# Patient Record
Sex: Male | Born: 1975 | Race: White | Hispanic: No | Marital: Married | Smoking: Former smoker
Health system: Southern US, Community
[De-identification: ages and names within clinical notes are randomized; demographics above are authoritative.]

## PROBLEM LIST (undated history)

## (undated) DIAGNOSIS — R51 Headache: Secondary | ICD-10-CM

## (undated) DIAGNOSIS — I10 Essential (primary) hypertension: Secondary | ICD-10-CM

## (undated) DIAGNOSIS — M1990 Unspecified osteoarthritis, unspecified site: Secondary | ICD-10-CM

## (undated) DIAGNOSIS — E119 Type 2 diabetes mellitus without complications: Secondary | ICD-10-CM

## (undated) HISTORY — PX: PILONIDAL CYST / SINUS EXCISION: SUR543

---

## 2011-11-11 ENCOUNTER — Other Ambulatory Visit: Payer: Self-pay | Admitting: Orthopedic Surgery

## 2011-11-12 ENCOUNTER — Encounter (HOSPITAL_COMMUNITY): Payer: Self-pay | Admitting: Respiratory Therapy

## 2011-11-13 ENCOUNTER — Encounter (HOSPITAL_COMMUNITY)
Admission: RE | Admit: 2011-11-13 | Discharge: 2011-11-13 | Disposition: A | Discharge status: Home / Self Care | Payer: Managed Care, Other (non HMO) | Source: Ambulatory Visit | Attending: Orthopedic Surgery | Admitting: Orthopedic Surgery

## 2011-11-13 ENCOUNTER — Encounter (HOSPITAL_COMMUNITY): Payer: Self-pay

## 2011-11-13 HISTORY — DX: Headache: R51

## 2011-11-13 HISTORY — DX: Unspecified osteoarthritis, unspecified site: M19.90

## 2011-11-13 LAB — URINALYSIS, ROUTINE W REFLEX MICROSCOPIC
Bilirubin Urine: NEGATIVE
Hgb urine dipstick: NEGATIVE
Ketones, ur: NEGATIVE mg/dL
Specific Gravity, Urine: 1.024 (ref 1.005–1.030)
Urobilinogen, UA: 0.2 mg/dL (ref 0.0–1.0)

## 2011-11-13 LAB — COMPREHENSIVE METABOLIC PANEL
Alkaline Phosphatase: 82 U/L (ref 39–117)
BUN: 9 mg/dL (ref 6–23)
Calcium: 10.3 mg/dL (ref 8.4–10.5)
Creatinine, Ser: 1.01 mg/dL (ref 0.50–1.35)
GFR calc Af Amer: >90 mL/min (ref >90)
Glucose, Bld: 105 mg/dL — ABNORMAL HIGH (ref 70–99)
Total Protein: 7.5 g/dL (ref 6.0–8.3)

## 2011-11-13 LAB — CBC WITH DIFFERENTIAL/PLATELET
Basophils Relative: 0 % (ref 0–1)
Eosinophils Absolute: 0.2 10*3/uL (ref 0.0–0.7)
Eosinophils Relative: 2 % (ref 0–5)
Hemoglobin: 17.7 g/dL — ABNORMAL HIGH (ref 13.0–17.0)
Lymphs Abs: 2.3 10*3/uL (ref 0.7–4.0)
MCH: 30.3 pg (ref 26.0–34.0)
MCHC: 36.4 g/dL — ABNORMAL HIGH (ref 30.0–36.0)
MCV: 83.1 fL (ref 78.0–100.0)
Monocytes Absolute: 0.5 10*3/uL (ref 0.1–1.0)
Monocytes Relative: 5 % (ref 3–12)
RBC: 5.85 MIL/uL — ABNORMAL HIGH (ref 4.22–5.81)

## 2011-11-13 LAB — TYPE AND SCREEN
ABO/RH(D): O POS
Antibody Screen: NEGATIVE

## 2011-11-13 LAB — PROTIME-INR
INR: 0.97 (ref 0.00–1.49)
Prothrombin Time: 13.1 seconds (ref 11.6–15.2)

## 2011-11-13 LAB — SURGICAL PCR SCREEN
MRSA, PCR: NEGATIVE
Staphylococcus aureus: POSITIVE — AB

## 2011-11-13 NOTE — Progress Notes (Signed)
REQ'D PRIOR EKG IF ANY AND OFFICE NOTES FROM [facility]

## 2011-11-13 NOTE — Pre-Procedure Instructions (Addendum)
20 Marvin Franco  11/13/2011   Your procedure is scheduled on:  11/20/11  Report to [facility] at 630 AM.  Call this number if you have problems the morning of surgery: [phone]   Remember:   Do not eat food or drink:After Midnight.    Take these medicines the morning of surgery with A SIP OF WATER:pain med   Do not wear jewelry, make-up or nail polish.  Do not wear lotions, powders, or perfumes. You may wear deodorant.  Do not shave 48 hours prior to surgery. Men may shave face and neck.  Do not bring valuables to the hospital.  Contacts, dentures or bridgework may not be worn into surgery.  Leave suitcase in the car. After surgery it may be brought to your room.  For patients admitted to the hospital, checkout time is 11:00 AM the day of discharge.   Patients discharged the day of surgery will not be allowed to drive home.  Name and phone number of your driver:brandi  Wife [phone]   Special Instructions: Incentive Spirometry - Practice and bring it with you on the day of surgery. and CHG Shower Use Special Wash: 1/2 bottle night before surgery and 1/2 bottle morning of surgery.   Please read over the following fact sheets that you were given: Pain Booklet, Coughing and Deep Breathing, Blood Transfusion Information, MRSA Information and Surgical Site Infection Prevention

## 2011-11-19 MED ORDER — DEXTROSE 5 % IV SOLN
3.0000 g | INTRAVENOUS | Status: AC
  Administered 2011-11-20: 3 g via INTRAVENOUS
  Filled 2011-11-19: qty 3000

## 2011-11-20 ENCOUNTER — Ambulatory Visit (HOSPITAL_COMMUNITY): Payer: Managed Care, Other (non HMO) | Admitting: Anesthesiology

## 2011-11-20 ENCOUNTER — Encounter (HOSPITAL_COMMUNITY)
Admission: RE | Disposition: A | Discharge status: Home / Self Care | Payer: Self-pay | Source: Ambulatory Visit | Attending: Orthopedic Surgery

## 2011-11-20 ENCOUNTER — Encounter (HOSPITAL_COMMUNITY): Payer: Self-pay | Admitting: Anesthesiology

## 2011-11-20 ENCOUNTER — Encounter (HOSPITAL_COMMUNITY): Payer: Self-pay | Admitting: *Deleted

## 2011-11-20 ENCOUNTER — Ambulatory Visit (HOSPITAL_COMMUNITY)
Admission: RE | Admit: 2011-11-20 | Discharge: 2011-11-20 | Disposition: A | Discharge status: Home / Self Care | Payer: Managed Care, Other (non HMO) | Source: Ambulatory Visit | Attending: Orthopedic Surgery | Admitting: Orthopedic Surgery

## 2011-11-20 ENCOUNTER — Ambulatory Visit (HOSPITAL_COMMUNITY): Payer: Managed Care, Other (non HMO)

## 2011-11-20 DIAGNOSIS — M5410 Radiculopathy, site unspecified: Secondary | ICD-10-CM

## 2011-11-20 DIAGNOSIS — F17200 Nicotine dependence, unspecified, uncomplicated: Secondary | ICD-10-CM | POA: Insufficient documentation

## 2011-11-20 DIAGNOSIS — M5126 Other intervertebral disc displacement, lumbar region: Secondary | ICD-10-CM | POA: Insufficient documentation

## 2011-11-20 DIAGNOSIS — Z01812 Encounter for preprocedural laboratory examination: Secondary | ICD-10-CM | POA: Insufficient documentation

## 2011-11-20 DIAGNOSIS — Z01818 Encounter for other preprocedural examination: Secondary | ICD-10-CM | POA: Insufficient documentation

## 2011-11-20 DIAGNOSIS — Z01810 Encounter for preprocedural cardiovascular examination: Secondary | ICD-10-CM | POA: Insufficient documentation

## 2011-11-20 HISTORY — PX: LUMBAR LAMINECTOMY/DECOMPRESSION MICRODISCECTOMY: SHX5026

## 2011-11-20 SURGERY — LUMBAR LAMINECTOMY/DECOMPRESSION MICRODISCECTOMY
Anesthesia: General | Site: Spine Lumbar | Laterality: Right | Wound class: Clean

## 2011-11-20 MED ORDER — NEOSTIGMINE METHYLSULFATE 1 MG/ML IJ SOLN
INTRAMUSCULAR | Status: DC | PRN
  Administered 2011-11-20: 3 mg via INTRAVENOUS

## 2011-11-20 MED ORDER — MIDAZOLAM HCL 5 MG/5ML IJ SOLN
INTRAMUSCULAR | Status: DC | PRN
  Administered 2011-11-20: 2 mg via INTRAVENOUS

## 2011-11-20 MED ORDER — LIDOCAINE HCL (CARDIAC) 20 MG/ML IV SOLN
INTRAVENOUS | Status: DC | PRN
  Administered 2011-11-20: 80 mg via INTRAVENOUS

## 2011-11-20 MED ORDER — ONDANSETRON HCL 4 MG/2ML IJ SOLN
INTRAMUSCULAR | Status: DC | PRN
  Administered 2011-11-20: 4 mg via INTRAVENOUS

## 2011-11-20 MED ORDER — HYDROMORPHONE HCL PF 1 MG/ML IJ SOLN
INTRAMUSCULAR | Status: AC
  Administered 2011-11-20: 0.5 mg via INTRAVENOUS
  Filled 2011-11-20: qty 1

## 2011-11-20 MED ORDER — BUPIVACAINE-EPINEPHRINE PF 0.25-1:200000 % IJ SOLN
INTRAMUSCULAR | Status: AC
  Filled 2011-11-20: qty 30

## 2011-11-20 MED ORDER — OXYCODONE-ACETAMINOPHEN 5-325 MG PO TABS: 1.0000 | ORAL_TABLET | ORAL | Status: AC | PRN

## 2011-11-20 MED ORDER — INDIGOTINDISULFONATE SODIUM 8 MG/ML IJ SOLN
INTRAMUSCULAR | Status: AC
  Filled 2011-11-20: qty 5

## 2011-11-20 MED ORDER — GLYCOPYRROLATE 0.2 MG/ML IJ SOLN
INTRAMUSCULAR | Status: DC | PRN
  Administered 2011-11-20: .4 mg via INTRAVENOUS

## 2011-11-20 MED ORDER — PROPOFOL 10 MG/ML IV EMUL
INTRAVENOUS | Status: DC | PRN
  Administered 2011-11-20: 300 mg via INTRAVENOUS

## 2011-11-20 MED ORDER — 0.9 % SODIUM CHLORIDE (POUR BTL) OPTIME
TOPICAL | Status: DC | PRN
  Administered 2011-11-20: 1000 mL

## 2011-11-20 MED ORDER — THROMBIN 20000 UNITS EX SOLR
CUTANEOUS | Status: DC | PRN
  Administered 2011-11-20: 09:00:00 via TOPICAL

## 2011-11-20 MED ORDER — BUPIVACAINE-EPINEPHRINE PF 0.25-1:200000 % IJ SOLN
INTRAMUSCULAR | Status: DC | PRN
  Administered 2011-11-20: 2 mL

## 2011-11-20 MED ORDER — METHYLPREDNISOLONE ACETATE 40 MG/ML IJ SUSP
40.0000 mg | INTRAMUSCULAR | Status: DC
  Filled 2011-11-20: qty 1

## 2011-11-20 MED ORDER — THROMBIN 20000 UNITS EX SOLR
CUTANEOUS | Status: AC
  Filled 2011-11-20: qty 20000

## 2011-11-20 MED ORDER — HYDROMORPHONE HCL PF 1 MG/ML IJ SOLN
0.2500 mg | INTRAMUSCULAR | Status: DC | PRN
  Administered 2011-11-20 (×4): 0.5 mg via INTRAVENOUS

## 2011-11-20 MED ORDER — POVIDONE-IODINE 7.5 % EX SOLN
Freq: Once | CUTANEOUS | Status: DC
  Filled 2011-11-20: qty 118

## 2011-11-20 MED ORDER — ROCURONIUM BROMIDE 100 MG/10ML IV SOLN
INTRAVENOUS | Status: DC | PRN
  Administered 2011-11-20 (×2): 10 mg via INTRAVENOUS
  Administered 2011-11-20: 50 mg via INTRAVENOUS

## 2011-11-20 MED ORDER — FENTANYL CITRATE 0.05 MG/ML IJ SOLN
INTRAMUSCULAR | Status: DC | PRN
  Administered 2011-11-20: 100 ug via INTRAVENOUS
  Administered 2011-11-20: 50 ug via INTRAVENOUS
  Administered 2011-11-20: 100 ug via INTRAVENOUS
  Administered 2011-11-20 (×2): 50 ug via INTRAVENOUS

## 2011-11-20 MED ORDER — INDIGOTINDISULFONATE SODIUM 8 MG/ML IJ SOLN
INTRAMUSCULAR | Status: DC | PRN
  Administered 2011-11-20: .5 mL

## 2011-11-20 MED ORDER — LACTATED RINGERS IV SOLN
INTRAVENOUS | Status: DC | PRN
  Administered 2011-11-20 (×2): via INTRAVENOUS

## 2011-11-20 MED ORDER — LIDOCAINE HCL 4 % MT SOLN
OROMUCOSAL | Status: DC | PRN
  Administered 2011-11-20: 4 mL via TOPICAL

## 2011-11-20 MED ORDER — DIAZEPAM 5 MG PO TABS: 5.0000 mg | ORAL_TABLET | Freq: Four times a day (QID) | ORAL | Status: AC | PRN

## 2011-11-20 SURGICAL SUPPLY — 58 items
BENZOIN TINCTURE PRP APPL 2/3 (GAUZE/BANDAGES/DRESSINGS) ×2 IMPLANT
BUR ROUND PRECISION 4.0 (BURR) ×2 IMPLANT
CANISTER SUCTION 2500CC (MISCELLANEOUS) ×2 IMPLANT
CLOSURE STERI-STRIP 1/4X4 (GAUZE/BANDAGES/DRESSINGS) ×2 IMPLANT
CLOTH BEACON ORANGE TIMEOUT ST (SAFETY) ×2 IMPLANT
CONT SPEC STER OR (MISCELLANEOUS) ×2 IMPLANT
CORDS BIPOLAR (ELECTRODE) ×2 IMPLANT
COVER SURGICAL LIGHT HANDLE (MISCELLANEOUS) ×2 IMPLANT
DRAIN CHANNEL 10F 3/8 F FF (DRAIN) IMPLANT
DRAPE POUCH INSTRU U-SHP 10X18 (DRAPES) ×4 IMPLANT
DRAPE SURG 17X23 STRL (DRAPES) ×8 IMPLANT
DURAPREP 26ML APPLICATOR (WOUND CARE) ×2 IMPLANT
ELECT BLADE 4.0 EZ CLEAN MEGAD (MISCELLANEOUS) ×2
ELECT BLADE 6.5 EXT (BLADE) IMPLANT
ELECT CAUTERY BLADE 6.4 (BLADE) ×2 IMPLANT
ELECT REM PT RETURN 9FT ADLT (ELECTROSURGICAL) ×2
ELECTRODE BLDE 4.0 EZ CLN MEGD (MISCELLANEOUS) ×1 IMPLANT
ELECTRODE REM PT RTRN 9FT ADLT (ELECTROSURGICAL) ×1 IMPLANT
EVACUATOR SILICONE 100CC (DRAIN) IMPLANT
FILTER STRAW FLUID ASPIR (MISCELLANEOUS) ×2 IMPLANT
GAUZE SPONGE 4X4 16PLY XRAY LF (GAUZE/BANDAGES/DRESSINGS) ×4 IMPLANT
GLOVE BIO SURGEON STRL SZ8 (GLOVE) ×2 IMPLANT
GLOVE BIOGEL PI IND STRL 8 (GLOVE) ×1 IMPLANT
GLOVE BIOGEL PI INDICATOR 8 (GLOVE) ×1
GOWN STRL NON-REIN LRG LVL3 (GOWN DISPOSABLE) ×4 IMPLANT
IV CATH 14GX2 1/4 (CATHETERS) ×2 IMPLANT
KIT BASIN OR (CUSTOM PROCEDURE TRAY) ×2 IMPLANT
KIT ROOM TURNOVER OR (KITS) ×2 IMPLANT
NEEDLE 18GX1X1/2 (RX/OR ONLY) (NEEDLE) ×2 IMPLANT
NEEDLE HYPO 25GX1X1/2 BEV (NEEDLE) ×2 IMPLANT
NEEDLE SPNL 18GX3.5 QUINCKE PK (NEEDLE) ×4 IMPLANT
NS IRRIG 1000ML POUR BTL (IV SOLUTION) ×2 IMPLANT
PACK LAMINECTOMY ORTHO (CUSTOM PROCEDURE TRAY) ×2 IMPLANT
PACK UNIVERSAL I (CUSTOM PROCEDURE TRAY) ×2 IMPLANT
PAD ARMBOARD 7.5X6 YLW CONV (MISCELLANEOUS) ×4 IMPLANT
PATTIES SURGICAL .5 X.5 (GAUZE/BANDAGES/DRESSINGS) IMPLANT
PATTIES SURGICAL .5 X1 (DISPOSABLE) IMPLANT
PATTIES SURGICAL 1X1 (DISPOSABLE) IMPLANT
SPONGE GAUZE 4X4 12PLY (GAUZE/BANDAGES/DRESSINGS) ×2 IMPLANT
STRIP CLOSURE SKIN 1/2X4 (GAUZE/BANDAGES/DRESSINGS) IMPLANT
SURGIFLO TRUKIT (HEMOSTASIS) IMPLANT
SUT ETHILON 3 0 FSL (SUTURE) IMPLANT
SUT VIC AB 0 CT1 27 (SUTURE)
SUT VIC AB 0 CT1 27XBRD ANBCTR (SUTURE) IMPLANT
SUT VIC AB 0 CT2 27 (SUTURE) ×2 IMPLANT
SUT VIC AB 1 CT1 18XCR BRD 8 (SUTURE) ×1 IMPLANT
SUT VIC AB 1 CT1 8-18 (SUTURE) ×1
SUT VIC AB 2-0 CT2 18 VCP726D (SUTURE) ×2 IMPLANT
SYR 20CC LL (SYRINGE) IMPLANT
SYR BULB IRRIGATION 50ML (SYRINGE) ×2 IMPLANT
SYR CONTROL 10ML LL (SYRINGE) ×4 IMPLANT
SYR TB 1ML 26GX3/8 SAFETY (SYRINGE) ×4 IMPLANT
SYR TB 1ML LUER SLIP (SYRINGE) ×4 IMPLANT
TAPE CLOTH SURG 4X10 WHT LF (GAUZE/BANDAGES/DRESSINGS) ×2 IMPLANT
TOWEL OR 17X24 6PK STRL BLUE (TOWEL DISPOSABLE) ×2 IMPLANT
TOWEL OR 17X26 10 PK STRL BLUE (TOWEL DISPOSABLE) ×2 IMPLANT
WATER STERILE IRR 1000ML POUR (IV SOLUTION) ×2 IMPLANT
YANKAUER SUCT BULB TIP NO VENT (SUCTIONS) ×2 IMPLANT

## 2011-11-20 NOTE — Anesthesia Preprocedure Evaluation (Addendum)
Anesthesia Evaluation  Patient identified by MRN, date of birth, ID band Patient awake    Reviewed: Allergy & Precautions, H&P , NPO status , Patient's Chart, lab work & pertinent test results, reviewed documented beta blocker date and time   Airway Mallampati: II TM Distance: >3 FB Neck ROM: Full    Dental  (+) Teeth Intact   Pulmonary Current Smoker,  breath sounds clear to auscultation  Pulmonary exam normal       Cardiovascular negative cardio ROS  Rhythm:Regular Rate:Normal     Neuro/Psych  Headaches, Pain symptoms noted.    GI/Hepatic negative GI ROS, Neg liver ROS,   Endo/Other  negative endocrine ROS  Renal/GU negative Renal ROS     Musculoskeletal  (+) Arthritis -,   Abdominal Normal abdominal exam  (+)   Peds  Hematology   Anesthesia Other Findings   Reproductive/Obstetrics                         Anesthesia Physical Anesthesia Plan  ASA: II  Anesthesia Plan: General   Post-op Pain Management:    Induction: Intravenous  Airway Management Planned: Oral ETT  Additional Equipment:   Intra-op Plan:   Post-operative Plan: Extubation in OR  Informed Consent: I have reviewed the patients History and Physical, chart, labs and discussed the procedure including the risks, benefits and alternatives for the proposed anesthesia with the patient or authorized representative who has indicated his/her understanding and acceptance.   Dental advisory given  Plan Discussed with: CRNA, Anesthesiologist and Surgeon  Anesthesia Plan Comments:         Anesthesia Quick Evaluation

## 2011-11-20 NOTE — Anesthesia Postprocedure Evaluation (Signed)
  Anesthesia Post-op Note  Patient: Marvin Franco  Procedure(s) Performed: Procedure(s) (LRB) with comments: LUMBAR LAMINECTOMY/DECOMPRESSION MICRODISCECTOMY (Right) - Right-sided lumbar 5-sacral 1 microdisectomy  Patient Location: PACU  Anesthesia Type: General  Level of Consciousness: awake  Airway and Oxygen Therapy: Patient Spontanous Breathing  Post-op Pain: mild  Post-op Assessment: Post-op Vital signs reviewed  Post-op Vital Signs: Reviewed  Complications: No apparent anesthesia complications

## 2011-11-20 NOTE — Transfer of Care (Signed)
Immediate Anesthesia Transfer of Care Note  Patient: Marvin Franco  Procedure(s) Performed: Procedure(s) (LRB) with comments: LUMBAR LAMINECTOMY/DECOMPRESSION MICRODISCECTOMY (Right) - Right-sided lumbar 5-sacral 1 microdisectomy  Patient Location: PACU  Anesthesia Type: General  Level of Consciousness: awake, alert  and oriented  Airway & Oxygen Therapy: Patient Spontanous Breathing and Patient connected to face mask oxygen  Post-op Assessment: Report given to PACU RN  Post vital signs: Reviewed and stable  Complications: No apparent anesthesia complications

## 2011-11-20 NOTE — H&P (Signed)
PREOPERATIVE H&P  Chief Complaint: right leg pain  HPI: Marvin Franco is a 36 y.o. male who presents with right leg pain  Past Medical History  Diagnosis Date  . Headache     migraines  . Arthritis    Past Surgical History  Procedure Date  . Pilonidal cyst / sinus excision    History   Social History  . Marital Status: Married    Spouse Name: N/A    Number of Children: N/A  . Years of Education: N/A   Social History Main Topics  . Smoking status: Former Smoker -- 1.0 packs/day for 20 years    Types: Cigarettes  . Smokeless tobacco: None  . Alcohol Use: No  . Drug Use: No  . Sexually Active:    Other Topics Concern  . None   Social History Narrative  . None   History reviewed. No pertinent family history. No Known Allergies Prior to Admission medications   Medication Sig Start Date End Date Taking? Authorizing Provider  HYDROcodone-acetaminophen (NORCO) 10-325 MG per tablet Take 1 tablet by mouth every 6 (six) hours as needed. For pain   Yes Historical Provider, MD     All other systems have been reviewed and were otherwise negative with the exception of those mentioned in the HPI and as above.  Physical Exam: There were no vitals filed for this visit.  General: Alert, no acute distress Cardiovascular: No pedal edema Respiratory: No cyanosis, no use of accessory musculature Skin: No lesions in the area of chief complaint Neurologic: Sensation intact distally Psychiatric: Patient is competent for consent with normal mood and affect Lymphatic: No axillary or cervical lymphadenopathy  MUSCULOSKELETAL: + SLR on riht  Assessment/Plan: Right leg pain Plan for Procedure(s): LUMBAR LAMINECTOMY/DECOMPRESSION MICRODISCECTOMY   Emilee Hero, MD 11/20/2011 6:36 AM

## 2011-11-20 NOTE — Preoperative (Signed)
Beta Blockers   Reason not to administer Beta Blockers:Not Applicable 

## 2011-11-21 ENCOUNTER — Encounter (HOSPITAL_COMMUNITY): Payer: Self-pay | Admitting: Orthopedic Surgery

## 2011-11-21 NOTE — Op Note (Signed)
NAMEELIU, BATCH NO.:  [id number]  MEDICAL RECORD NO.:  [id number]  LOCATION:  [facility]                         FACILITY:  [facility]  PHYSICIAN:  Estill Bamberg, MD      DATE OF BIRTH:  [birth year]  DATE OF PROCEDURE:  11/20/2011 DATE OF DISCHARGE:  11/20/2011                              OPERATIVE REPORT   PREOPERATIVE DIAGNOSIS:  Right-sided S1 radiculopathy secondary to right- sided L5-S1 extruded disk fragment located behind the S1 vertebral body.  POSTOPERATIVE DIAGNOSIS:  Right-sided S1 radiculopathy secondary to right-sided L5-S1 extruded disk fragment located behind the S1 vertebral body.  PROCEDURE:  Right-sided L5-S1 laminotomy, partial facetectomy, and foraminotomy with removal of an extruded L5-S1 disk fragment causing compression of the traversing S1 nerve.  SURGEON:  Estill Bamberg, MD  ASSISTANT:  None.  ANESTHESIA:  General endotracheal anesthesia.  COMPLICATIONS:  None.  DISPOSITION:  Stable.  ESTIMATED BLOOD LOSS:  Minimal.  INDICATIONS FOR PROCEDURE:  Briefly, Marvin Franco is a pleasant 36 year old male who presented to me with severe pain in the right leg.  An MRI was notable for an extruded L5-S1 disk fragment causing obvious compression of the traversing S1 nerve.  We did go forward with conservative care including right-sided S1 selective nerve block and the patient did not get any long-term relief.  The patient and myself therefore did have a discussion regarding going forward with an L5-S1 diskectomy.  The patient fully understood the risks and limitations of the procedure. Specifically, he did understand the risk of infection as well as the risk of a cerebrospinal fluid leak, a recurrent disk herniation, as well as the possibility of postoperative instability and postoperative bleeding, any of which may require additional surgery.  OPERATIVE DETAILS:  On November 20, 2011, the patient was brought to Surgery and general endotracheal  anesthesia was administered.  The patient was placed prone on a well-padded flat Jackson bed with a Wilson frame.  The back was prepped and draped in the usual sterile fashion. Time-out procedure was performed.  I then placed spinal needles over the midline and a lateral intraoperative radiograph was obtained in order to help optimize the location of the incision.  A one and a half incision was then made at the midline overlying the L5-S1 interspace.  The fascia was sharply incised in a curvilinear fashion and the paraspinal musculature was bluntly swept laterally.  The lamina of L5 and S1 were identified and an additional lateral intraoperative radiograph to confirm the appropriate operative level.  I did use a 4-mm high-speed bur to remove the medial and inferior aspect of L5.  I did use a curette to tease the ligamentum flavum off of the superior aspect of S1.  The ligamentum flavum was removed.  The traversing S1 nerve was identified and noted to be under tension.  There were abundant epidural veins noted anterior to the S1 nerve and these were coagulated using bipolar electrocautery.  I then had an assistant to hold the medial retraction of the traversing S1 nerve.  There was no obvious compression at the level of the intervertebral disk.  However, more inferiorly, there was readily identified an obvious disk fragment, which again was  noted to be extruded and located behind the S1 vertebral body.  I did use a nerve hook to introduce the disk fragments more superiorly to an area where they can be removed.  There were total of two large fragments that were removed uneventfully using a pituitary rongeur.  At the termination of this portion of the procedure, I was easily able to mobilize the nerve medially and laterally and there was no undue tension on the nerve at this point.  At this point, the wound was copiously irrigated using approximately 500 mL of normal saline.  I then  controlled all epidural bleeding using bipolar electrocautery in addition to Gelfoam and thrombin.  All epidural bleeding was controlled.  I also used bone wax to help control some venous bleeding from the surface of the laminotomy. I then infiltrated 40 mg Depo-Medrol about the traversing S1 nerve.  The fascia was then closed using #1 Vicryl.  The subcutaneous layer was closed using 2-0 Vicryl and the skin was then closed using 3-0 Monocryl. Benzoin and Steri-Strips were applied followed by sterile dressing.  All instrument counts were correct at the termination of the procedure.     Estill Bamberg, MD     MD/MEDQ  D:  11/20/2011  T:  11/21/2011  Job:  [id number]

## 2013-11-24 IMAGING — CR DG CHEST 2V
2 series · 2 of 2 positions shown · non-contrast
Comparison: None

CLINICAL DATA: Preoperative assessment for lumbar spine surgery,
history smoking

CHEST - 2 VIEW

[view not recorded (1 of 2)]
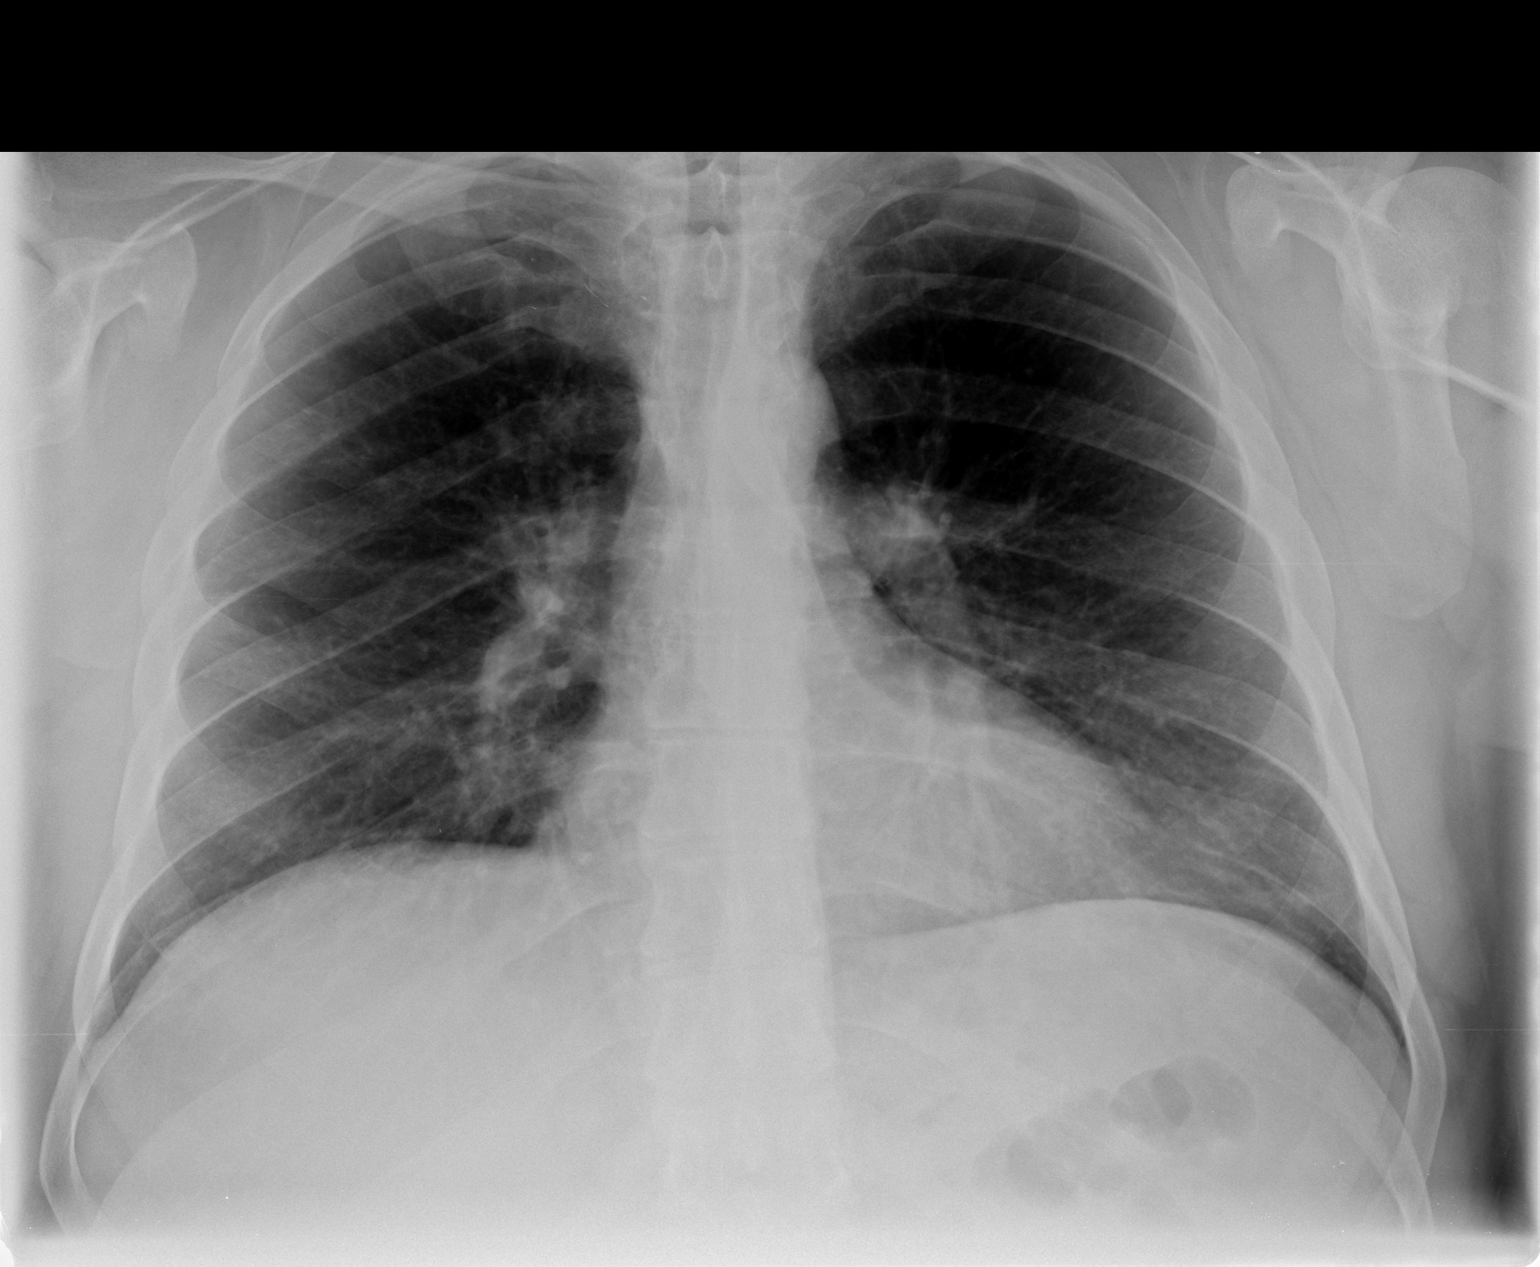

[view not recorded (2 of 2)]
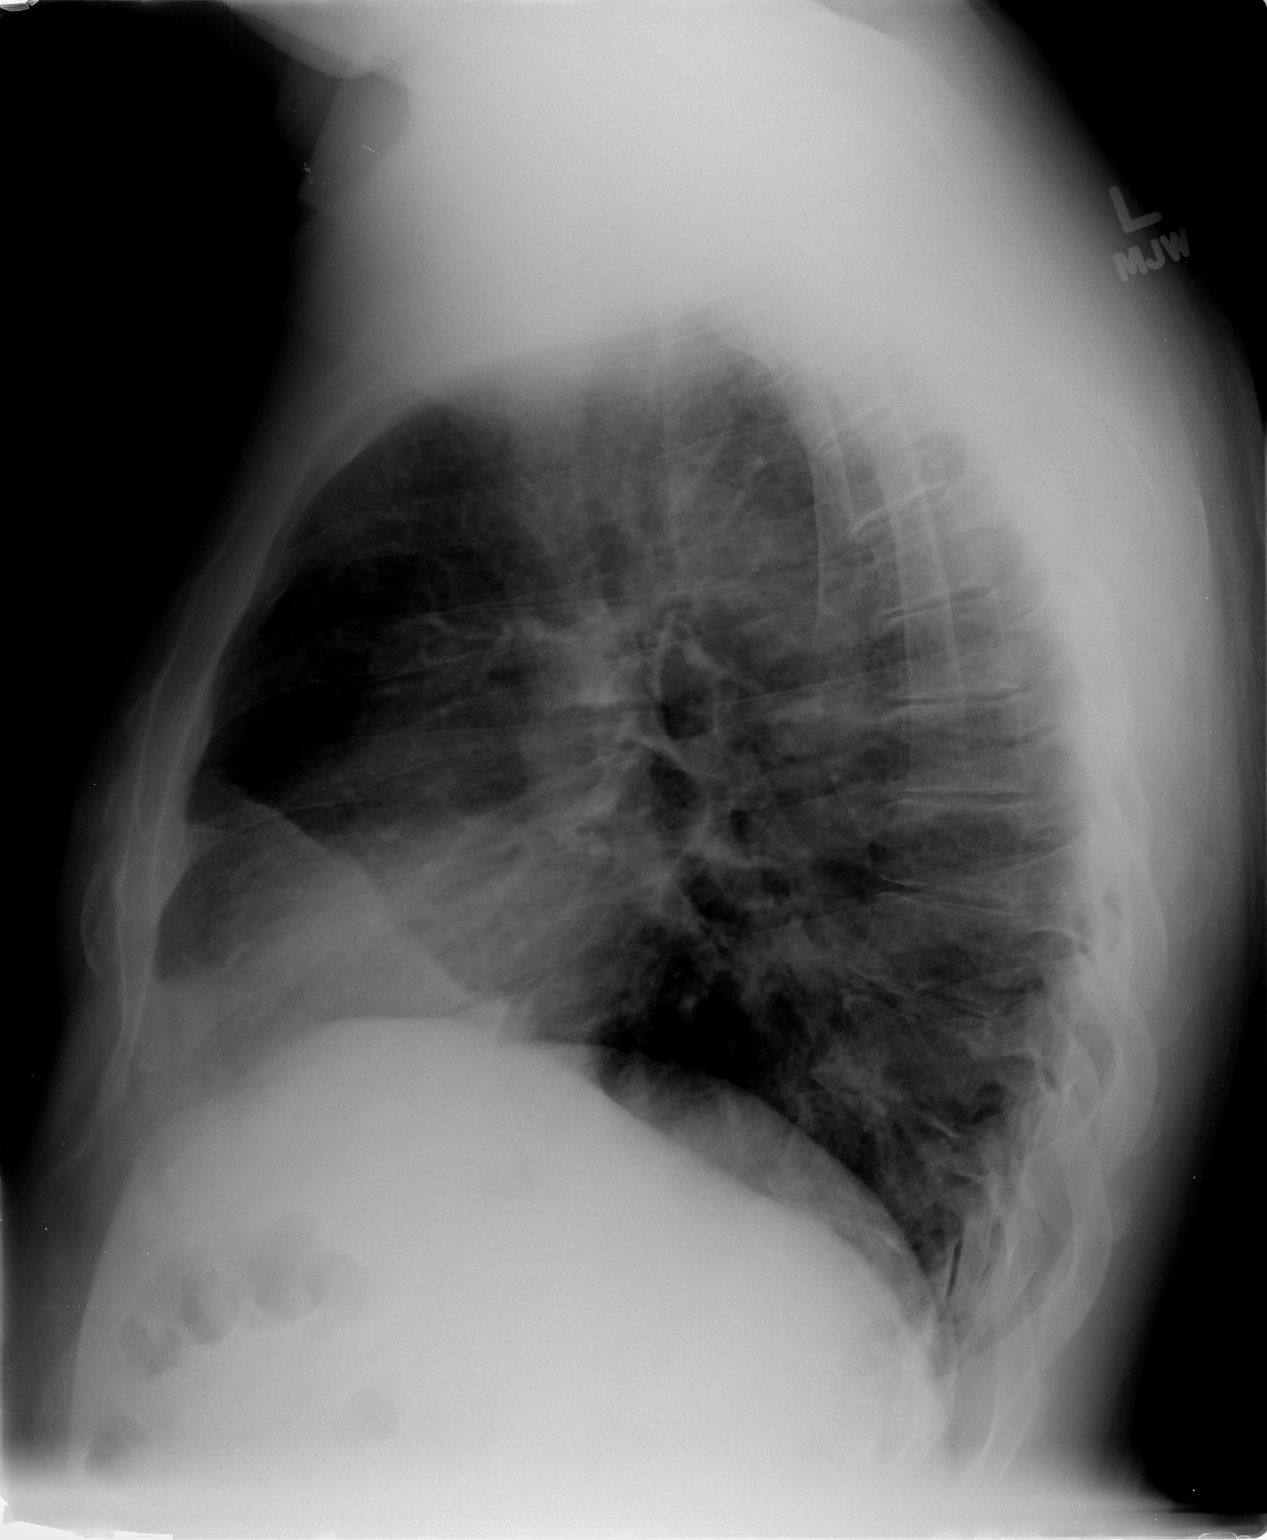

[2 of 2 positions shown; findings below may reference images not displayed]

FINDINGS: Upper-normal size of cardiac silhouette.
Mediastinal contours and pulmonary vascularity normal.
Lungs clear.
No pleural effusion or pneumothorax.
Bones unremarkable.
IMPRESSION: No acute abnormalities.

## 2013-12-01 IMAGING — CR DG LUMBAR SPINE 2-3V
2 series · 2 of 2 positions shown · non-contrast
Comparison: None

CLINICAL DATA: Intraoperative localization for lumbar surgery.

LUMBAR SPINE - 2-3 VIEW

[lateral (1 of 2)]
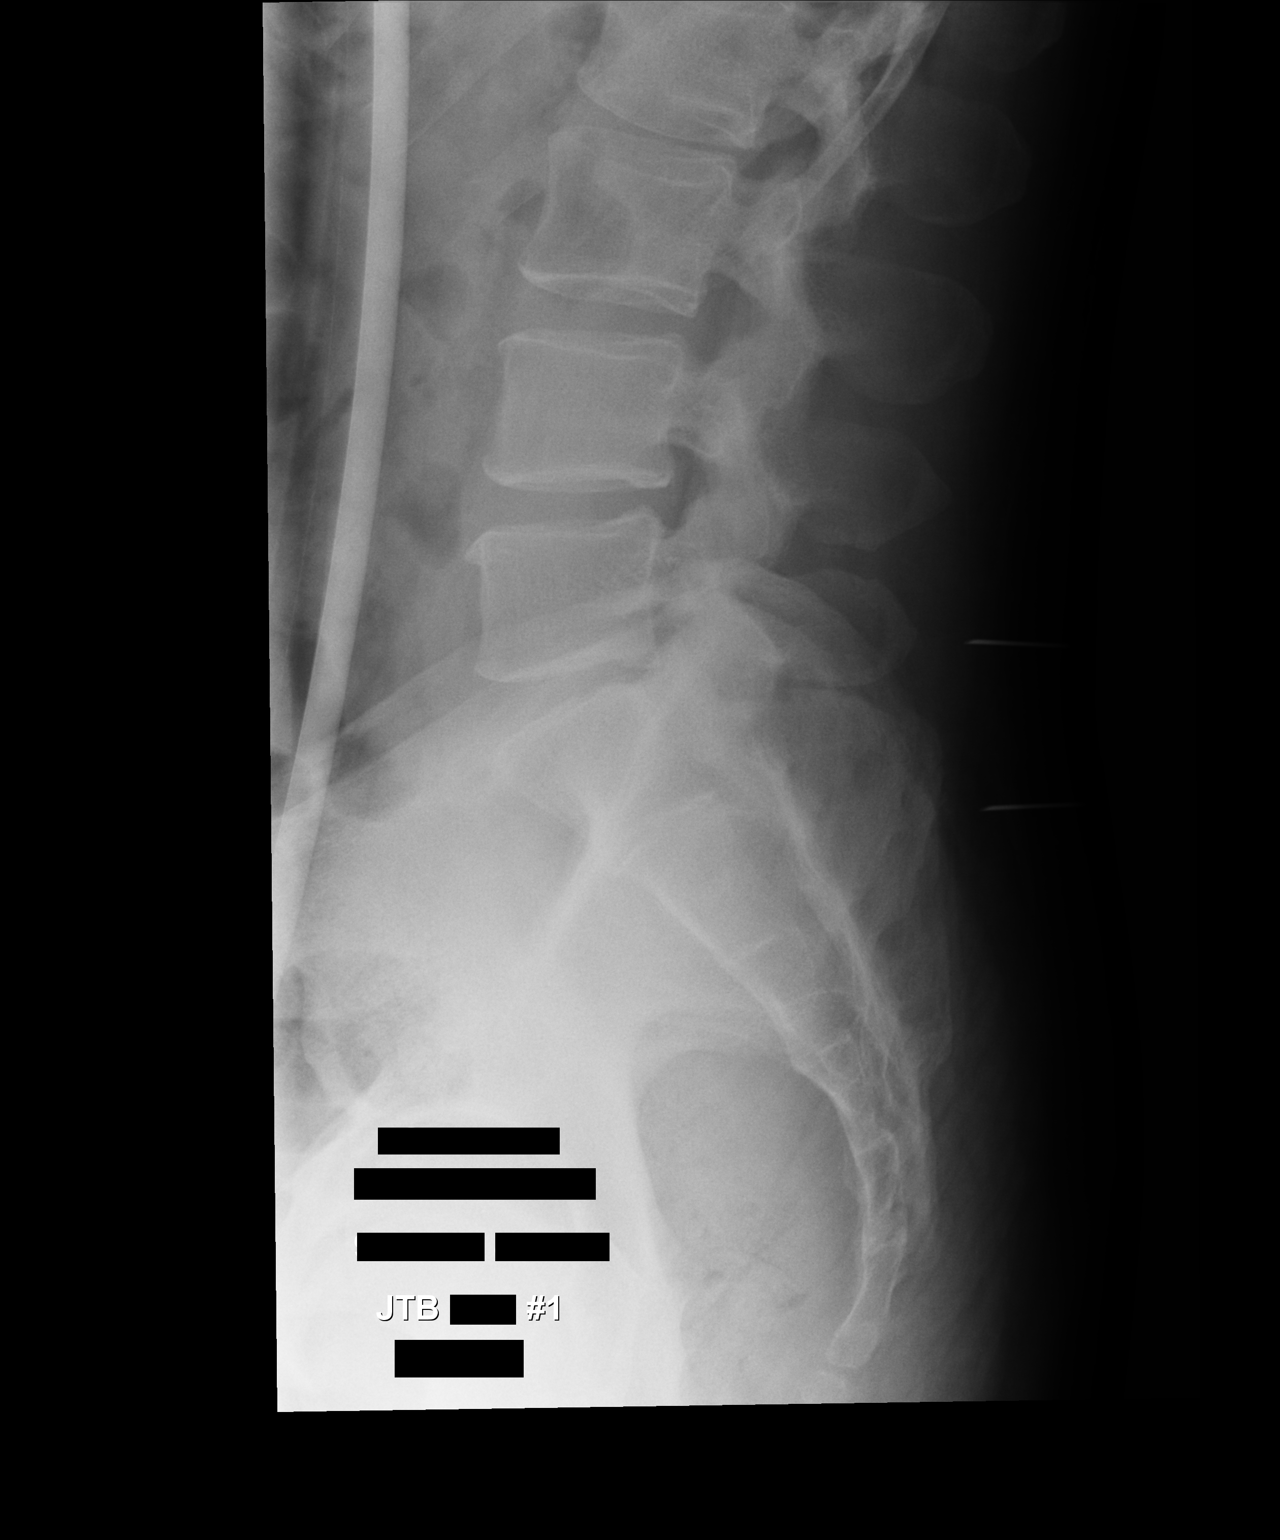

[lateral (2 of 2)]
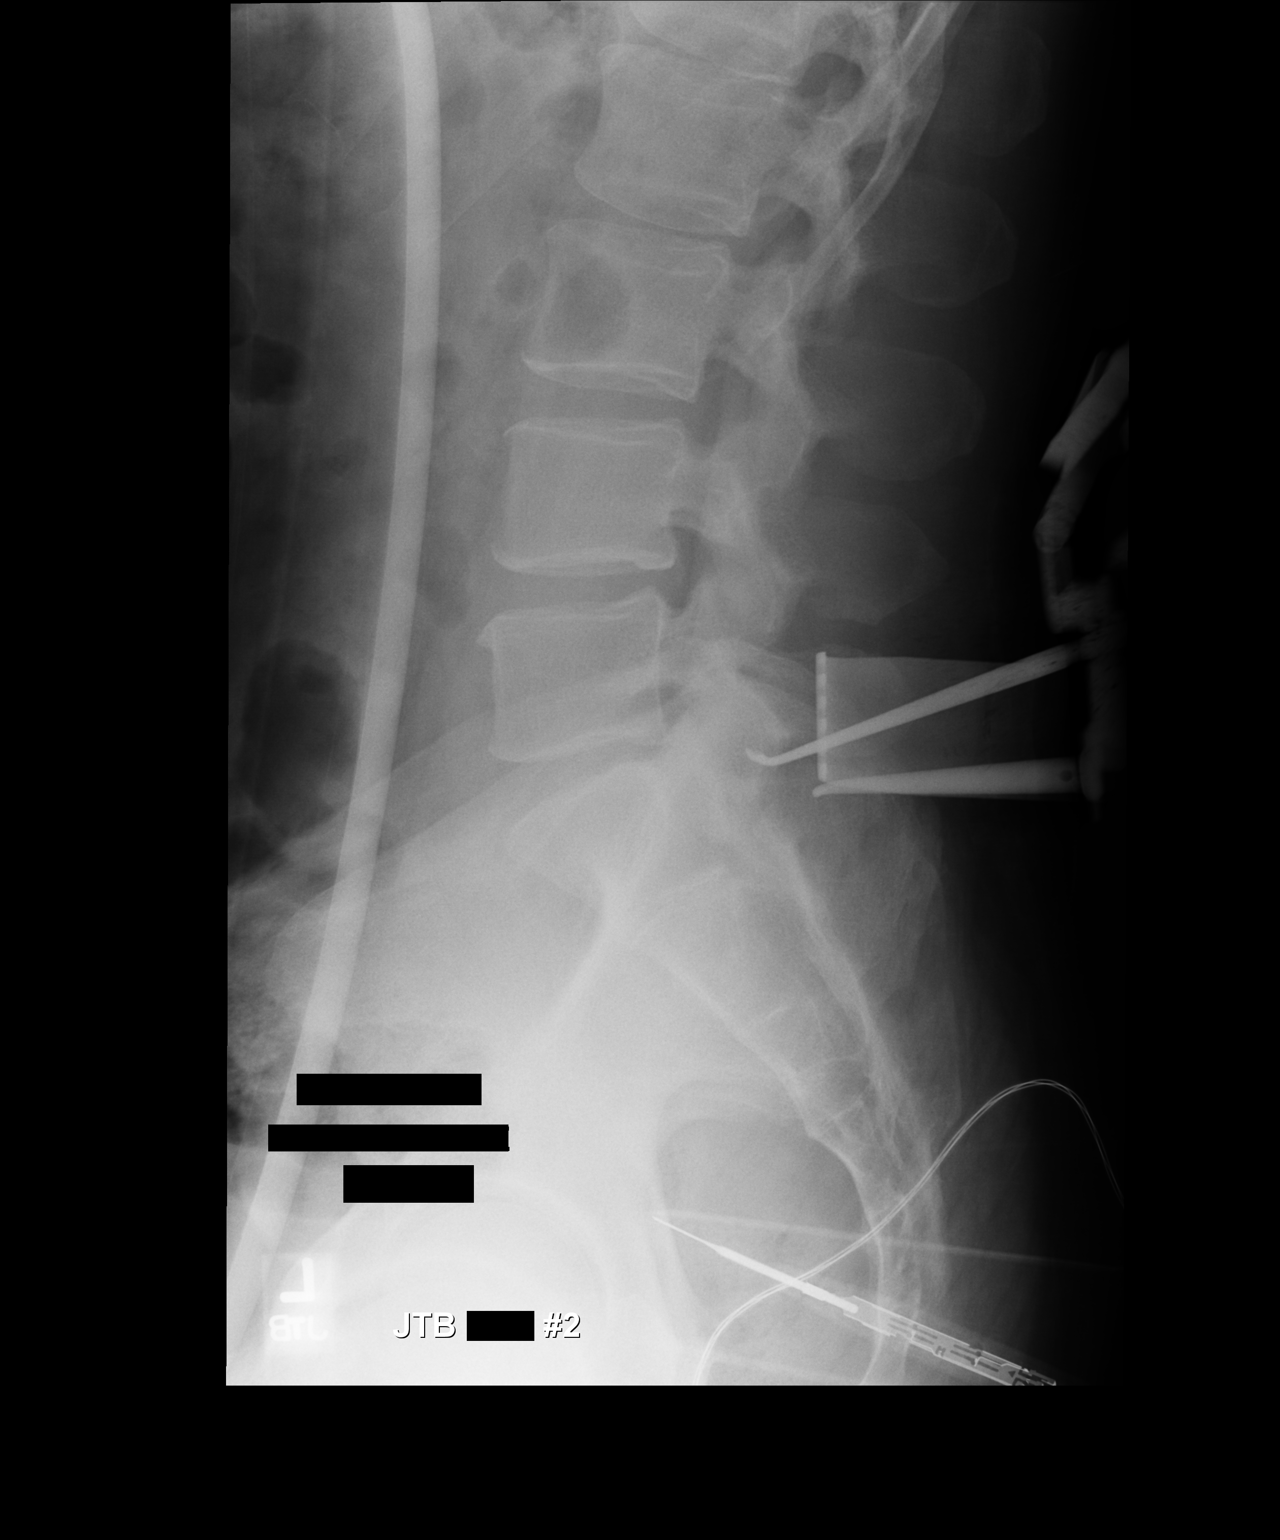

[2 of 2 positions shown; findings below may reference images not displayed]

FINDINGS: Lateral lumbar spine film labeled #1 demonstrates spinal
needles projecting at the L5 and S1 spinous processes.

Film labeled #2 demonstrates a surgical instrument marking the L5-
S1 disc space.
IMPRESSION: L5-S1 marked intraoperatively.

## 2020-01-08 ENCOUNTER — Other Ambulatory Visit: Payer: Self-pay

## 2020-01-08 ENCOUNTER — Emergency Department
Admission: RE | Admit: 2020-01-08 | Discharge: 2020-01-08 | Disposition: A | Discharge status: Home / Self Care | Payer: Managed Care, Other (non HMO) | Source: Ambulatory Visit

## 2020-01-08 VITALS — BP 147/91 | HR 100 | Temp 99.6°F | Resp 16 | Ht 74.0 in | Wt 230.0 lb

## 2020-01-08 DIAGNOSIS — I10 Essential (primary) hypertension: Secondary | ICD-10-CM

## 2020-01-08 DIAGNOSIS — R059 Cough, unspecified: Secondary | ICD-10-CM

## 2020-01-08 DIAGNOSIS — J019 Acute sinusitis, unspecified: Secondary | ICD-10-CM | POA: Diagnosis not present

## 2020-01-08 DIAGNOSIS — E1165 Type 2 diabetes mellitus with hyperglycemia: Secondary | ICD-10-CM

## 2020-01-08 HISTORY — DX: Type 2 diabetes mellitus without complications: E11.9

## 2020-01-08 HISTORY — DX: Essential (primary) hypertension: I10

## 2020-01-08 MED ORDER — CETIRIZINE HCL 10 MG PO TABS: 10.0000 mg | ORAL_TABLET | Freq: Every day | ORAL | 0 refills | Status: AC

## 2020-01-08 MED ORDER — AMOXICILLIN 875 MG PO TABS: 875.0000 mg | ORAL_TABLET | Freq: Two times a day (BID) | ORAL | 0 refills | Status: AC

## 2020-01-08 NOTE — ED Provider Notes (Signed)
[facility]   MRN: [id number] DOB: 11/26/1975  Subjective:   Marvin Franco is a 44 y.o. male presenting for 8-day history of persistent sinus congestion, sinus pain, cough that elicits chest pain. Patient has a history of uncontrolled diabetes, last blood sugar checked this morning was greater than 200. His son initially tested positive for COVID-19 about a month ago. His wife tested positive about 2 weeks ago. Patient has taken 3 total COVID-19 test and all have been negative. The last one was yesterday, results are pending. Patient is also a smoker. Has not been smoking since he has felt ill.  No current facility-administered medications for this encounter.  Current Outpatient Medications:  .  atorvastatin (LIPITOR) 20 MG tablet, Take 20 mg by mouth daily., Disp: , Rfl:  .  clomiPHENE (CLOMID) 50 MG tablet, Take by mouth daily., Disp: , Rfl:  .  Empagliflozin-metFORMIN HCl ER (SYNJARDY XR) 25-1000 MG TB24, Take by mouth., Disp: , Rfl:  .  lisinopril (ZESTRIL) 10 MG tablet, Take 10 mg by mouth daily., Disp: , Rfl:    No Known Allergies  Past Medical History:  Diagnosis Date  . Arthritis   . Diabetes mellitus without complication (HCC)   . Headache(784.0)    migraines  . Hypertension      Past Surgical History:  Procedure Laterality Date  . LUMBAR LAMINECTOMY/DECOMPRESSION MICRODISCECTOMY  11/20/2011   Procedure: LUMBAR LAMINECTOMY/DECOMPRESSION MICRODISCECTOMY;  Surgeon: Emilee Hero, MD;  Location: [facility] OR;  Service: Orthopedics;  Laterality: Right;  Right-sided lumbar 5-sacral 1 microdisectomy  . PILONIDAL CYST / SINUS EXCISION      No family history on file.  Social History   Tobacco Use  . Smoking status: Former Smoker    Packs/day: 1.00    Years: 20.00    Pack years: 20.00    Types: Cigarettes  Substance Use Topics  . Alcohol use: No  . Drug use: No    ROS   Objective:   Vitals: BP (!) 147/91 (BP Location: Right Arm)   Pulse 100    Temp 99.6 F (37.6 C) (Oral)   Resp 16   Ht 6\' 2"  (1.88 m)   Wt 230 lb (104.3 kg)   SpO2 96%   BMI 29.53 kg/m   Physical Exam Constitutional:      General: He is not in acute distress.    Appearance: Normal appearance. He is well-developed. He is not ill-appearing, toxic-appearing or diaphoretic.  HENT:     Head: Normocephalic and atraumatic.     Right Ear: External ear normal.     Left Ear: External ear normal.     Nose: Nose normal.     Mouth/Throat:     Mouth: Mucous membranes are moist.     Pharynx: Oropharynx is clear.  Eyes:     General: No scleral icterus.    Extraocular Movements: Extraocular movements intact.     Pupils: Pupils are equal, round, and reactive to light.  Cardiovascular:     Rate and Rhythm: Normal rate and regular rhythm.     Heart sounds: Normal heart sounds. No murmur heard.  No friction rub. No gallop.   Pulmonary:     Effort: Pulmonary effort is normal. No respiratory distress.     Breath sounds: Normal breath sounds. No stridor. No wheezing, rhonchi or rales.  Neurological:     Mental Status: He is alert and oriented to person, place, and time.  Psychiatric:        Mood  and Affect: Mood normal.        Behavior: Behavior normal.        Thought Content: Thought content normal.        Judgment: Judgment normal.       Assessment and Plan :   PDMP not reviewed this encounter.  1. Acute non-recurrent sinusitis, unspecified location   2. Cough   3. Uncontrolled type 2 diabetes mellitus with hyperglycemia (HCC)   4. Essential hypertension     Will start empiric treatment for sinusitis with amoxicillin.  Recommended supportive care otherwise including the use of oral antihistamine, decongestant. Counseled patient on potential for adverse effects with medications prescribed/recommended today, ER and return-to-clinic precautions discussed, patient verbalized understanding.    Wallis Bamberg, PA-C 01/08/20 1531

## 2020-01-08 NOTE — ED Triage Notes (Signed)
Patient here on day 8 of congestion and cough that hurts chest; son had covid one month ago and wife had covid 2 weeks ago; he has taken 3 covid tests in past week; last test done yesterday which he is awaiting results. He has not had covid vaccination. No OTC today.

## 2020-01-08 NOTE — Discharge Instructions (Signed)
Please start amoxicillin to address sinus infection. Make sure that you are taking Zyrtec as well to help with the sinus congestion. I have also prescribed you some cough medications. If your symptoms worsen or persist come back to our clinic for recheck.

## 2020-01-10 LAB — NOVEL CORONAVIRUS, NAA: SARS-CoV-2, NAA: NOT DETECTED

## 2020-01-10 LAB — SARS-COV-2, NAA 2 DAY TAT
